# Patient Record
Sex: Male | Born: 1955 | Race: White | Hispanic: No | Marital: Married | Smoking: Never smoker
Health system: Southern US, Community
[De-identification: ages and names within clinical notes are randomized; demographics above are authoritative.]

## PROBLEM LIST (undated history)

## (undated) DIAGNOSIS — D649 Anemia, unspecified: Secondary | ICD-10-CM

## (undated) DIAGNOSIS — N189 Chronic kidney disease, unspecified: Secondary | ICD-10-CM

## (undated) DIAGNOSIS — I10 Essential (primary) hypertension: Secondary | ICD-10-CM

## (undated) DIAGNOSIS — E785 Hyperlipidemia, unspecified: Secondary | ICD-10-CM

## (undated) DIAGNOSIS — K219 Gastro-esophageal reflux disease without esophagitis: Secondary | ICD-10-CM

## (undated) HISTORY — DX: Anemia, unspecified: D64.9

## (undated) HISTORY — DX: Chronic kidney disease, unspecified: N18.9

## (undated) HISTORY — PX: OTHER SURGICAL HISTORY: SHX169

## (undated) HISTORY — DX: Gastro-esophageal reflux disease without esophagitis: K21.9

## (undated) HISTORY — DX: Hyperlipidemia, unspecified: E78.5

## (undated) HISTORY — DX: Essential (primary) hypertension: I10

---

## 2000-09-20 ENCOUNTER — Encounter: Admission: RE | Admit: 2000-09-20 | Discharge: 2000-09-20 | Payer: Self-pay | Admitting: Nephrology

## 2000-09-20 ENCOUNTER — Encounter: Payer: Self-pay | Admitting: Nephrology

## 2001-01-28 ENCOUNTER — Encounter: Admission: RE | Admit: 2001-01-28 | Discharge: 2001-01-28 | Payer: Self-pay | Admitting: Nephrology

## 2001-01-28 ENCOUNTER — Encounter: Payer: Self-pay | Admitting: Nephrology

## 2003-03-31 ENCOUNTER — Encounter: Admission: RE | Admit: 2003-03-31 | Discharge: 2003-03-31 | Payer: Self-pay | Admitting: Nephrology

## 2006-02-22 ENCOUNTER — Encounter: Admission: RE | Admit: 2006-02-22 | Discharge: 2006-02-22 | Payer: Self-pay | Admitting: Nephrology

## 2010-01-26 ENCOUNTER — Encounter
Admission: RE | Admit: 2010-01-26 | Discharge: 2010-01-26 | Payer: Self-pay | Source: Home / Self Care | Attending: Nephrology | Admitting: Nephrology

## 2011-04-11 ENCOUNTER — Encounter: Payer: Self-pay | Admitting: Internal Medicine

## 2011-04-18 ENCOUNTER — Encounter: Payer: Self-pay | Admitting: *Deleted

## 2011-04-24 ENCOUNTER — Encounter: Payer: Self-pay | Admitting: Internal Medicine

## 2011-04-24 ENCOUNTER — Telehealth: Payer: Self-pay | Admitting: Internal Medicine

## 2011-04-24 ENCOUNTER — Other Ambulatory Visit (INDEPENDENT_AMBULATORY_CARE_PROVIDER_SITE_OTHER): Payer: BC Managed Care – PPO

## 2011-04-24 ENCOUNTER — Ambulatory Visit (INDEPENDENT_AMBULATORY_CARE_PROVIDER_SITE_OTHER): Payer: BC Managed Care – PPO | Admitting: Internal Medicine

## 2011-04-24 VITALS — BP 132/80 | HR 96 | Ht 63.0 in | Wt 130.0 lb

## 2011-04-24 DIAGNOSIS — R112 Nausea with vomiting, unspecified: Secondary | ICD-10-CM

## 2011-04-24 DIAGNOSIS — K219 Gastro-esophageal reflux disease without esophagitis: Secondary | ICD-10-CM

## 2011-04-24 DIAGNOSIS — R634 Abnormal weight loss: Secondary | ICD-10-CM

## 2011-04-24 DIAGNOSIS — R197 Diarrhea, unspecified: Secondary | ICD-10-CM

## 2011-04-24 LAB — COMPREHENSIVE METABOLIC PANEL
AST: 28 U/L (ref 0–37)
Albumin: 4.5 g/dL (ref 3.5–5.2)
BUN: 20 mg/dL (ref 6–23)
CO2: 28 mEq/L (ref 19–32)
Calcium: 9.9 mg/dL (ref 8.4–10.5)
Chloride: 102 mEq/L (ref 96–112)
Creatinine, Ser: 2.3 mg/dL — ABNORMAL HIGH (ref 0.4–1.5)
GFR: 31.34 mL/min — ABNORMAL LOW (ref >60.00)
Glucose, Bld: 90 mg/dL (ref 70–99)
Potassium: 4.5 mEq/L (ref 3.5–5.1)

## 2011-04-24 LAB — CBC WITH DIFFERENTIAL/PLATELET
Basophils Absolute: 0 10*3/uL (ref 0.0–0.1)
Basophils Relative: 0.5 % (ref 0.0–3.0)
Eosinophils Absolute: 0.5 10*3/uL (ref 0.0–0.7)
Lymphocytes Relative: 19.9 % (ref 12.0–46.0)
MCHC: 32.8 g/dL (ref 30.0–36.0)
Monocytes Relative: 4.7 % (ref 3.0–12.0)
Neutrophils Relative %: 66.4 % (ref 43.0–77.0)
RBC: 4.93 Mil/uL (ref 4.22–5.81)

## 2011-04-24 LAB — LIPASE: Lipase: 40 U/L (ref 11.0–59.0)

## 2011-04-24 LAB — SEDIMENTATION RATE: Sed Rate: 11 mm/hr (ref 0–22)

## 2011-04-24 MED ORDER — ESOMEPRAZOLE MAGNESIUM 40 MG PO CPDR: 40.0000 mg | DELAYED_RELEASE_CAPSULE | Freq: Two times a day (BID) | ORAL | Status: DC

## 2011-04-24 NOTE — Telephone Encounter (Signed)
Hold Flagyl  For now.

## 2011-04-24 NOTE — Telephone Encounter (Signed)
Patient is taking Flagyl per Dr. Jimmy Footman. He wants to know if he should continue taking this. Please, advise.

## 2011-04-24 NOTE — Progress Notes (Signed)
Justin Owens 11-13-1955 MRN [id number]   History of Present Illness:  This is a 56 year old white male with progressive nausea and vomiting for the past year associated with diarrhea. He has lost about 10 pounds in 2 months. He apparently is also running a low-grade temperature. Vomiting of undigested food occurs within minutes or several hours after eating. He has taken omeprazole 20 mg a day without improvement. He was seen in 1992 by Dr.Pulliam and had some tests done for diarrhea including a flexible sigmoidoscopy at which time there was apparently a question of Crohn's disease. We unfortunately don't have those records. He has a diagnosis of IgA nephropathy, chronic renal insufficiency, anemia and high blood pressure. He denies dysphagia or odynophagia. He doesn't smoke and doesn't drink alcohol. His dentist has pointed out tooth enamel erosions which may be caused by acid reflux or vomiting. He, often vomits in the middle of the night. After evacuating the food, he usually feels better.     Past Medical History  Diagnosis Date  . GERD (gastroesophageal reflux disease)   . Hypertension   . CKD (chronic kidney disease)   . Hyperlipidemia   . Gout   . Hyperparathyroidism   . Anemia    No past surgical history on file.  reports that he has never smoked. He has never used smokeless tobacco. He reports that he does not drink alcohol or use illicit drugs. family history includes Diabetes in an unspecified family member and Heart disease in an unspecified family member.  There is no history of Colon cancer. No Known Allergies      Review of Systems:Small stature alert and oriented. Weight loss of 10 pounds. He denies rectal bleeding  The remainder of the 10 point ROS is negative except as outlined in H&P   Physical Exam: General appearance small stature, in no distress. Eyes- non icteric. HEENT nontraumatic, normocephalic. Mouth no lesions, tongue papillated, no cheilosis. Neck  supple without adenopathy, thyroid not enlarged, no carotid bruits, no JVD. Lungs Clear to auscultation bilaterally. Cor normal S1, normal S2, regular rhythm, no murmur,  quiet precordium. Abdomen: Soft nontender with normoactive bowel sounds. No distention. Liver edge at costal margin. No fullness.  Rectal:Soft Hemoccult negative stool.  Extremities no pedal edema. Skin no lesions. Neurological alert and oriented x 3. Psychological normal mood and affect.  Assessment and Plan:  Problem #1 Severe gastroesophageal reflux refractory to a low dose of PPI. The reflux has progressed. I  suspect that there may be  an anatomic obstruction, at the level of gastric outlet or in the small bowl.Severe gastroparesis may also result in increasing reflux. We need to rule out a terminal ileum obstruction by Crohn's disease. We have given him Nexium 40 mg twice a day until we can make a  diagnosis. He has Phenergan at home.  Problem #2 Chronic intermittent diarrhea and weight loss with a questionable history of Crohn's disease. We will proceed with a CT scan of the abdomen to assess his terminal ileum and will also obtain baseline chemistries including sedimentation rate and sprue profile. He will almost certainly need an upper endoscopy and colonoscopy but I feel that a CT scan of the abdomen should be done first to rule out a small bowel obstruction which could preclude him prepping for colonoscopy.   04/24/2011 Justin Owens

## 2011-04-24 NOTE — Patient Instructions (Addendum)
Your physician has requested that you go to the basement for the following lab work before leaving today: CMET, CBC, Amylase, Lipase, Sed Rate, Celiac Panel You have been scheduled for a CT scan of the abdomen and pelvis at [facility] ([address]---this is in the same building as [facility]).   You are scheduled on Thursday 04/26/11 at 10:00 am. You should arrive 15 minutes prior to your appointment time for registration. Please follow the written instructions below on the day of your exam:  WARNING: IF YOU ARE ALLERGIC TO IODINE/X-RAY DYE, PLEASE NOTIFY RADIOLOGY IMMEDIATELY AT [phone]! YOU WILL BE GIVEN A 13 HOUR PREMEDICATION PREP.  1) Do not eat or drink anything after 6:00 am (4 hours prior to your test) 2) You have been given 2 bottles of oral contrast to drink. The solution may taste better if refrigerated, but do NOT add ice or any other liquid to this solution. Shake well before drinking.    Drink 1 bottle of contrast @ 8:00 am (2 hours prior to your exam)  Drink 1 bottle of contrast @ 9:00 am (1 hour prior to your exam)  You may take any medications as prescribed with a small amount of water except for the following: Metformin, Glucophage, Glucovance, Avandamet, Riomet, Fortamet, Actoplus Met, Janumet, Glumetza or Metaglip. The above medications must be held the day of the exam AND 48 hours after the exam.  The purpose of you drinking the oral contrast is to aid in the visualization of your intestinal tract. The contrast solution may cause some diarrhea. Before your exam is started, you will be given a small amount of fluid to drink. Depending on your individual set of symptoms, you may also receive an intravenous injection of x-ray contrast/dye. Plan on being at [facility] for 30 minutes or long, depending on the type of exam you are having performed.  If you have any questions regarding your exam or if you need to reschedule, you may call the CT  department at [phone] between the hours of 8:00 am and 5:00 pm, Monday-Friday.  ________________________________________________________________________ We have sent the following medications to your pharmacy for you to pick up at your convenience: Nexium CC: Dr Jimmy Footman

## 2011-04-25 LAB — CELIAC PANEL 10
IgA: 301 mg/dL (ref 68–379)
Tissue Transglutaminase Ab, IgA: 4.6 U/mL (ref <20)

## 2011-04-25 NOTE — Telephone Encounter (Signed)
Spoke with patient and gave him Dr. Brodie's recommendation 

## 2011-04-26 ENCOUNTER — Ambulatory Visit (INDEPENDENT_AMBULATORY_CARE_PROVIDER_SITE_OTHER)
Admission: RE | Admit: 2011-04-26 | Discharge: 2011-04-26 | Disposition: A | Discharge status: Home / Self Care | Payer: BC Managed Care – PPO | Source: Ambulatory Visit | Attending: Internal Medicine | Admitting: Internal Medicine

## 2011-04-26 DIAGNOSIS — R112 Nausea with vomiting, unspecified: Secondary | ICD-10-CM

## 2011-04-30 ENCOUNTER — Encounter: Payer: Self-pay | Admitting: *Deleted

## 2011-07-03 ENCOUNTER — Other Ambulatory Visit: Payer: Self-pay | Admitting: Internal Medicine

## 2011-07-03 ENCOUNTER — Encounter: Payer: Self-pay | Admitting: Internal Medicine

## 2011-07-03 ENCOUNTER — Ambulatory Visit (AMBULATORY_SURGERY_CENTER): Payer: BC Managed Care – PPO | Admitting: *Deleted

## 2011-07-03 VITALS — Ht 63.0 in | Wt 138.8 lb

## 2011-07-03 DIAGNOSIS — K219 Gastro-esophageal reflux disease without esophagitis: Secondary | ICD-10-CM

## 2011-07-03 DIAGNOSIS — R112 Nausea with vomiting, unspecified: Secondary | ICD-10-CM

## 2011-07-03 DIAGNOSIS — R197 Diarrhea, unspecified: Secondary | ICD-10-CM

## 2011-07-03 MED ORDER — MOVIPREP 100 G PO SOLR: ORAL | Status: DC

## 2011-07-03 MED ORDER — ESOMEPRAZOLE MAGNESIUM 40 MG PO CPDR: 40.0000 mg | DELAYED_RELEASE_CAPSULE | Freq: Two times a day (BID) | ORAL | Status: DC

## 2011-07-03 NOTE — Progress Notes (Signed)
No allergies to eggs or soybeans 

## 2011-07-03 NOTE — Telephone Encounter (Signed)
RX sent

## 2011-07-04 ENCOUNTER — Telehealth: Payer: Self-pay | Admitting: Internal Medicine

## 2011-07-04 NOTE — Telephone Encounter (Signed)
I saw him 2 months ago, he was very sick. I cannot make any assessment at this time. He ought to follow with my recommendations. Nexiem 40 mg bid was given to tie him over  Till we can find out more about his symptoms.

## 2011-07-04 NOTE — Telephone Encounter (Signed)
Patient calling to see if he needs to have the ECOL that is scheduled since he is feeling better. He states the Nexium has helped his problem. He is taking it BID. He has gained 8 lbs pack. He is wondering if he can cancel the procedures. He is concerned about his insurance not paying the colonoscopy as screening since he came in with problems. Please, advise.

## 2011-07-05 NOTE — Telephone Encounter (Signed)
Patient notified of Dr. Brodie's recommendations. 

## 2011-07-05 NOTE — Telephone Encounter (Signed)
Left a message for patient to call me. 

## 2011-07-09 ENCOUNTER — Telehealth: Payer: Self-pay | Admitting: Internal Medicine

## 2011-07-09 NOTE — Telephone Encounter (Signed)
Spoke with patient, and he is not running a fever at this time.  He said that his head was stuffed up, and that he is blowing out yellow discharge.   I told him that this would not affect his procedures tomorrow, but if it lingered or went to his chest that he should see his PCP.   He agreed.  I also told him to call us if he starts running a fever.   He also agreed to that as well.  He said that he would take his medication for sinus and take his prep later.

## 2011-07-10 ENCOUNTER — Encounter: Payer: Self-pay | Admitting: Internal Medicine

## 2011-07-10 ENCOUNTER — Ambulatory Visit (AMBULATORY_SURGERY_CENTER): Payer: BC Managed Care – PPO | Admitting: Internal Medicine

## 2011-07-10 VITALS — BP 106/64 | HR 76 | Temp 96.9°F | Resp 19 | Ht 63.0 in | Wt 130.0 lb

## 2011-07-10 DIAGNOSIS — K219 Gastro-esophageal reflux disease without esophagitis: Secondary | ICD-10-CM

## 2011-07-10 DIAGNOSIS — D1330 Benign neoplasm of unspecified part of small intestine: Secondary | ICD-10-CM

## 2011-07-10 DIAGNOSIS — R197 Diarrhea, unspecified: Secondary | ICD-10-CM

## 2011-07-10 DIAGNOSIS — R112 Nausea with vomiting, unspecified: Secondary | ICD-10-CM

## 2011-07-10 DIAGNOSIS — D126 Benign neoplasm of colon, unspecified: Secondary | ICD-10-CM

## 2011-07-10 MED ORDER — SODIUM CHLORIDE 0.9 % IV SOLN: 500.0000 mL | INTRAVENOUS | Status: DC

## 2011-07-10 NOTE — Progress Notes (Signed)
10:36 Levin Erp, CRNA hung a 2nd bag of normal saline 0.9% 500 ml. Maw

## 2011-07-10 NOTE — Progress Notes (Signed)
Patient did not have preoperative order for IV antibiotic SSI prophylaxis. (G8918)  Patient did not experience any of the following events: a burn prior to discharge; a fall within the facility; wrong site/side/patient/procedure/implant event; or a hospital transfer or hospital admission upon discharge from the facility. (G8907)  

## 2011-07-10 NOTE — Patient Instructions (Addendum)
YOU HAD AN ENDOSCOPIC PROCEDURE TODAY AT THE [facility]: Refer to the procedure report that was given to you for any specific questions about what was found during the examination.  If the procedure report does not answer your questions, please call your gastroenterologist to clarify.  If you requested that your care partner not be given the details of your procedure findings, then the procedure report has been included in a sealed envelope for you to review at your convenience later.  YOU SHOULD EXPECT: Some feelings of bloating in the abdomen. Passage of more gas than usual.  Walking can help get rid of the air that was put into your GI tract during the procedure and reduce the bloating. If you had a lower endoscopy (such as a colonoscopy or flexible sigmoidoscopy) you may notice spotting of blood in your stool or on the toilet paper. If you underwent a bowel prep for your procedure, then you may not have a normal bowel movement for a few days.  DIET: Your first meal following the procedure should be a light meal and then it is ok to progress to your normal diet.  A half-sandwich or bowl of soup is an example of a good first meal.  Heavy or fried foods are harder to digest and may make you feel nauseous or bloated.  Likewise meals heavy in dairy and vegetables can cause extra gas to form and this can also increase the bloating.  Drink plenty of fluids but you should avoid alcoholic beverages for 24 hours.  ACTIVITY: Your care partner should take you home directly after the procedure.  You should plan to take it easy, moving slowly for the rest of the day.  You can resume normal activity the day after the procedure however you should NOT DRIVE or use heavy machinery for 24 hours (because of the sedation medicines used during the test).    SYMPTOMS TO REPORT IMMEDIATELY: A gastroenterologist can be reached at any hour.  During normal business hours, 8:30 AM to 5:00 PM Monday through Friday,  call [phone].  After hours and on weekends, please call the GI answering service at [phone] who will take a message and have the physician on call contact you.   Following lower endoscopy (colonoscopy or flexible sigmoidoscopy):  Excessive amounts of blood in the stool  Significant tenderness or worsening of abdominal pains  Swelling of the abdomen that is new, acute  Fever of 100F or higher  Following upper endoscopy (EGD)  Vomiting of blood or coffee ground material  New chest pain or pain under the shoulder blades  Painful or persistently difficult swallowing  New shortness of breath  Fever of 100F or higher  Black, tarry-looking stools  FOLLOW UP: If any biopsies were taken you will be contacted by phone or by letter within the next 1-3 weeks.  Call your gastroenterologist if you have not heard about the biopsies in 3 weeks.  Our staff will call the home number listed on your records the next business day following your procedure to check on you and address any questions or concerns that you may have at that time regarding the information given to you following your procedure. This is a courtesy call and so if there is no answer at the home number and we have not heard from you through the emergency physician on call, we will assume that you have returned to your regular daily activities without incident.  SIGNATURES/CONFIDENTIALITY: You and/or your care   partner have signed paperwork which will be entered into your electronic medical record.  These signatures attest to the fact that that the information above on your After Visit Summary has been reviewed and is understood.  Full responsibility of the confidentiality of this discharge information lies with you and/or your care-partner.  

## 2011-07-10 NOTE — Progress Notes (Signed)
10:23 Per Levin Erp, CRNA sinus rhythm then sinus pause.  Dr. Olevia Perches was notified by Freda Munro.  Levin Erp, CRNA administered robinal 0.2 mg iv.  Dr.  Olevia Perches asked if she should stop the colonoscopy.  Per Levin Erp, CRNA "I think this is vagal and it will resolve when you get to the cecum".  Dr. Olevia Perches proceed with the exam.  Maw

## 2011-07-10 NOTE — Op Note (Signed)
[facility] [address] [place], [place]  [id number]  ENDOSCOPY PROCEDURE REPORT  PATIENT:  Justin Owens, Justin Owens  MR#:  [id number] BIRTHDATE:  Feb 01, 1955, 55 yrs. old  GENDER:  male  ENDOSCOPIST:  Lowella Bandy. Olevia Perches, MD Referred by:  Melinda Crutch, M.D.  PROCEDURE DATE:  07/10/2011 PROCEDURE:  EGD with biopsy, [address] ASA CLASS:  Class II INDICATIONS:  nausea and vomiting, GERD, weight loss hx IgA nephropathy  MEDICATIONS:   MAC sedation, administered by CRNA, propofol (Diprivan) 200 mg TOPICAL ANESTHETIC:  none  DESCRIPTION OF PROCEDURE:   After the risks benefits and alternatives of the procedure were thoroughly explained, informed consent was obtained.  The LB GIF-H180 [id number] endoscope was introduced through the mouth and advanced to the second portion of the duodenum, without limitations.  The instrument was slowly withdrawn as the mucosa was fully examined. <<PROCEDUREIMAGES>>  irregular Z-line. With standard forceps, a biopsy was obtained and sent to pathology (see image1, image6, and image7). r/o Barrett's A hiatal hernia was found (see image8 and image5).  Otherwise the examination was normal. With standard forceps, a biopsy was obtained and sent to pathology (see image3, image2, and image4). small bowl biopsy to r/o villous atrophy    Retroflexed views revealed no abnormalities.    The scope was then withdrawn from the patient and the procedure completed.  COMPLICATIONS:  None  ENDOSCOPIC IMPRESSION: 1) Irregular Z-line 2) Hiatal hernia 3) Otherwise normal examination s/p small bowl bx and g-e junction bx RECOMMENDATIONS: 1) Await biopsy results 2) Anti-reflux regimen to be follow continue Nexiem 40 mg daily  REPEAT EXAM:  In 0 year(s) for.  ______________________________ Lowella Bandy. Olevia Perches, MD  CC:  n. eSIGNED:   Lowella Bandy. Takyla Kuchera at 07/10/2011 10:52 AM  [name], [id number]

## 2011-07-10 NOTE — Op Note (Signed)
[facility] [place], [place]  [id number]  COLONOSCOPY PROCEDURE REPORT  PATIENT:  Justin, Owens  MR#:  [id number] BIRTHDATE:  [birth year], 55 yrs. old  GENDER:  male ENDOSCOPIST:  Lowella Bandy. Olevia Perches, MD REF. BY:  Melinda Crutch, M.D. PROCEDURE DATE:  07/10/2011 PROCEDURE:  Colonoscopy with biopsy and snare polypectomy ASA CLASS:  Class II INDICATIONS:  unexplained diarrhea, weight loss MEDICATIONS:   MAC sedation, administered by CRNA, propofol (Diprivan) 150 mg  DESCRIPTION OF PROCEDURE:   After the risks and benefits and of the procedure were explained, informed consent was obtained. Digital rectal exam was performed and revealed no rectal masses. The LB CF-H180AL [id number] endoscope was introduced through the anus and advanced to the cecum, which was identified by both the appendix and ileocecal valve.  The quality of the prep was good, using MoviPrep.  The instrument was then slowly withdrawn as the colon was fully examined. <<PROCEDUREIMAGES>>  FINDINGS:  Two polyps were found. 3 mm sessile polyp at 80 cm, 20 mm multilobulated polyp at 15 cm The polyp was removed using cold biopsy forceps. Polyps were snared, then cauterized with monopolar cautery. Retrieval was successful (see image2, image5, image6, and image7). snare polyp  This was otherwise a normal examination of the colon. Random biopsies were obtained and sent to pathology (see image8, image4, and image3). r/o microscopic colitis Retroflexed views in the rectum revealed no abnormalities.    The scope was then withdrawn from the patient and the procedure completed.  COMPLICATIONS:  None ENDOSCOPIC IMPRESSION: 1) Two polyps 2) Otherwise normal examination s/p polypectomies RECOMMENDATIONS: 1) Await pathology results  REPEAT EXAM:  In 3 - 5 year(s) for.  ______________________________ Lowella Bandy. Olevia Perches, MD  CC:  n. eSIGNED:   Lowella Bandy. Arlicia Paquette at 07/10/2011 10:56 AM  [name],  [id number]

## 2011-07-10 NOTE — Progress Notes (Signed)
The pt tolerated the egd very well. Maw   

## 2011-07-10 NOTE — Progress Notes (Signed)
No sinus pause noted once the cecum was reached and the scope was being with drawn.  The pt remained at sinus rhythm. Maw

## 2011-07-11 ENCOUNTER — Telehealth: Payer: Self-pay

## 2011-07-11 NOTE — Telephone Encounter (Signed)
Left message

## 2011-07-16 ENCOUNTER — Encounter: Payer: Self-pay | Admitting: Internal Medicine

## 2011-08-07 ENCOUNTER — Other Ambulatory Visit: Payer: Self-pay | Admitting: Internal Medicine

## 2012-09-04 ENCOUNTER — Other Ambulatory Visit: Payer: Self-pay | Admitting: Internal Medicine

## 2013-01-01 ENCOUNTER — Other Ambulatory Visit: Payer: Self-pay | Admitting: Internal Medicine

## 2013-04-29 ENCOUNTER — Other Ambulatory Visit: Payer: Self-pay | Admitting: Internal Medicine

## 2013-05-06 ENCOUNTER — Other Ambulatory Visit: Payer: Self-pay | Admitting: Family Medicine

## 2013-05-06 DIAGNOSIS — S0990XA Unspecified injury of head, initial encounter: Secondary | ICD-10-CM

## 2013-05-07 ENCOUNTER — Other Ambulatory Visit: Payer: BC Managed Care – PPO

## 2013-08-21 IMAGING — CT CT ABD-PELV W/O CM
2 of 4 series · 17 of 46 positions shown, 19 images · non-contrast
Comparison: None

CLINICAL DATA: Nausea and vomiting.  Rule out bowel obstruction.

CT ABDOMEN AND PELVIS WITHOUT CONTRAST
TECHNIQUE: Multidetector CT imaging of the abdomen and pelvis was
performed following the standard protocol without intravenous
contrast.

[Series 2: ap without · axial · non-contrast · 0.80mm/px · z∈[-428,-58]mm · 14 of 82 slices shown, 16 images]
[im 4/82  soft-tissue]
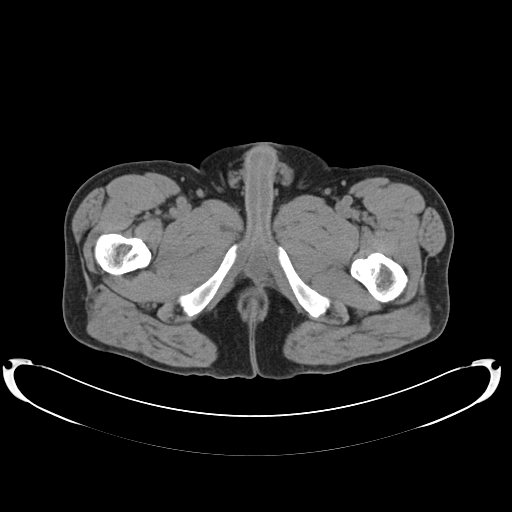
[im 4/82  bone]
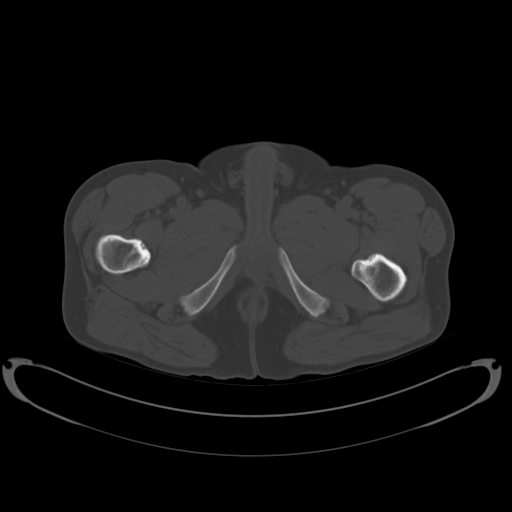
[im 11/82  soft-tissue]
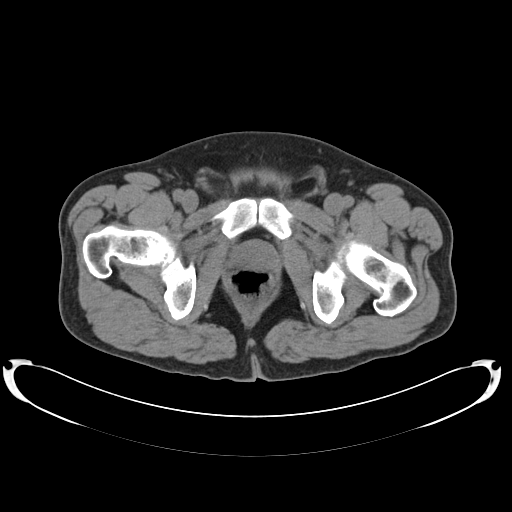
[im 17/82  soft-tissue]
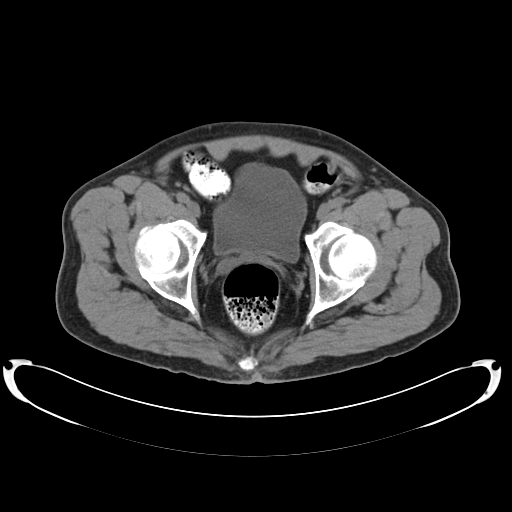
[im 21/82  soft-tissue]
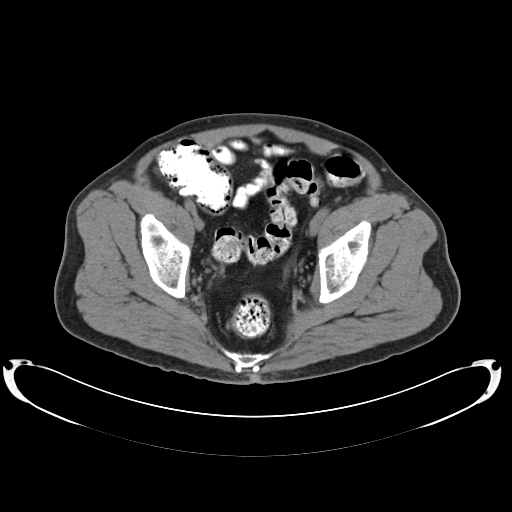
[im 28/82  soft-tissue]
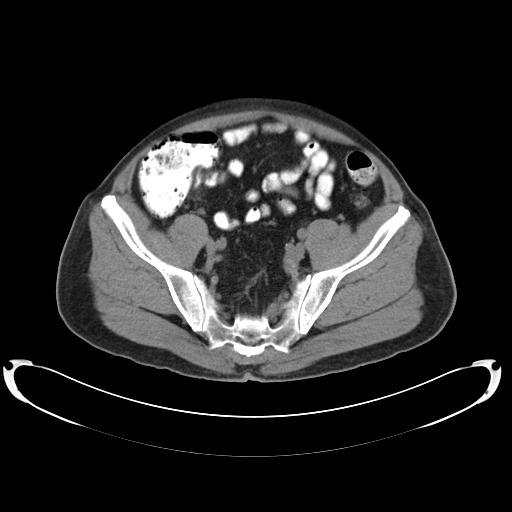
[im 34/82  soft-tissue]
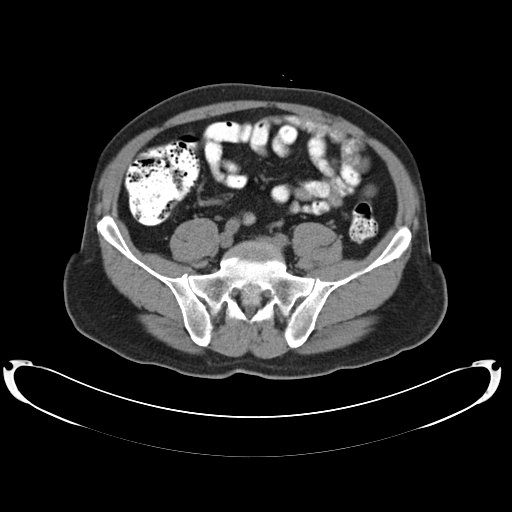
[im 38/82  soft-tissue]
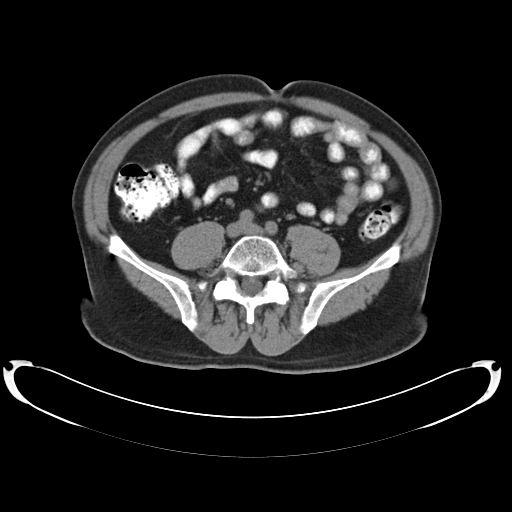
[im 44/82  soft-tissue]
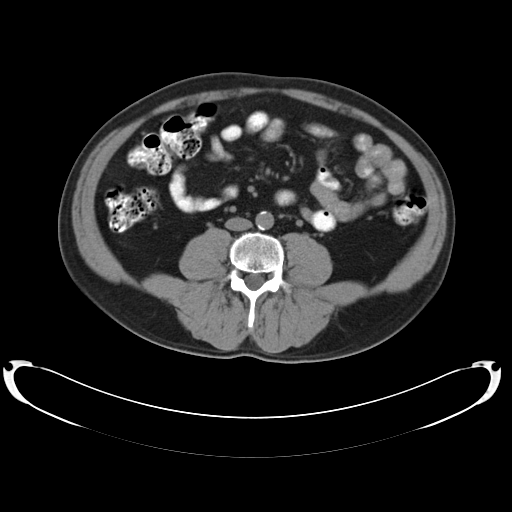
[im 48/82  soft-tissue]
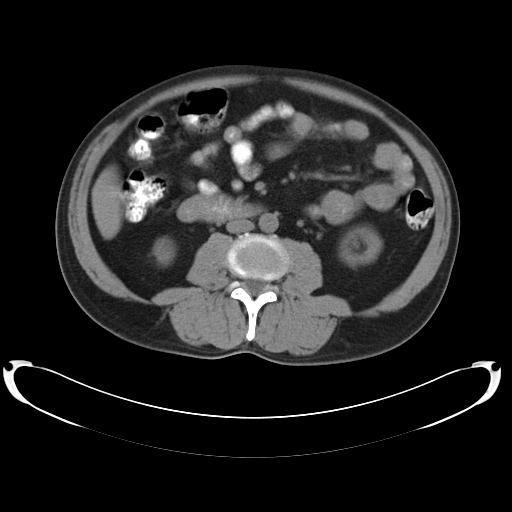
[im 48/82  bone]
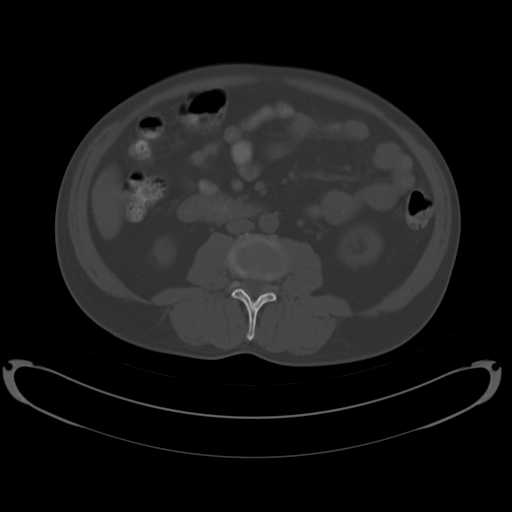
[im 55/82  soft-tissue]
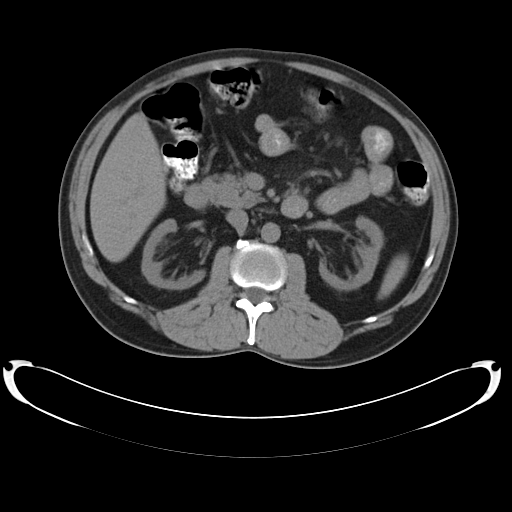
[im 61/82  soft-tissue]
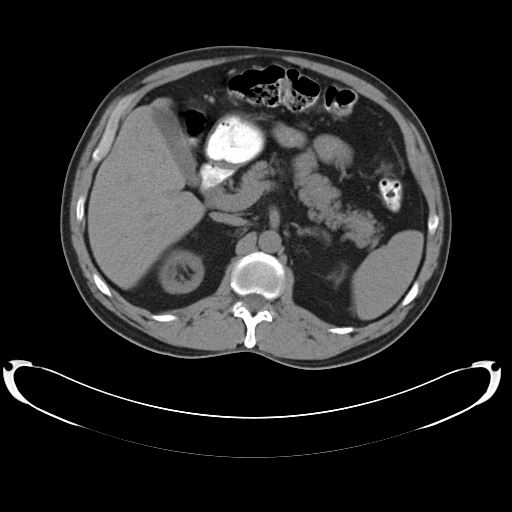
[im 65/82  soft-tissue]
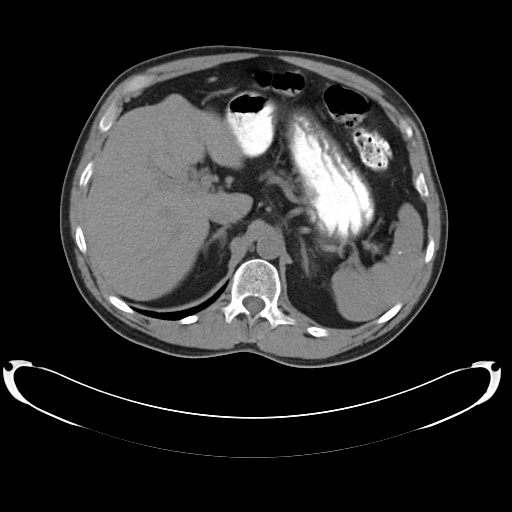
[im 71/82  soft-tissue]
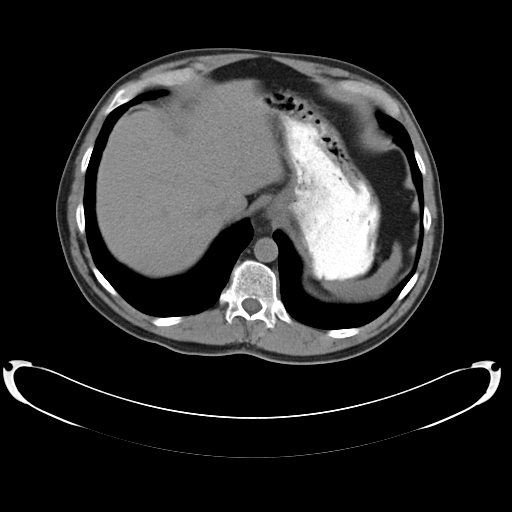
[im 78/82  soft-tissue]
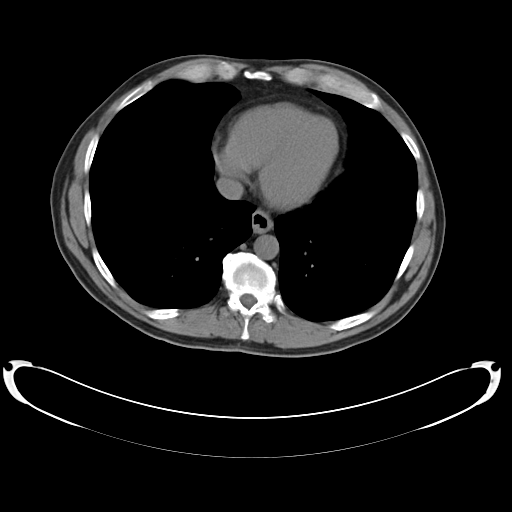

[Series 602: coronals · coronal · 0.83mm/px · 3 of 121 slices shown]
[im 41/121  soft-tissue]
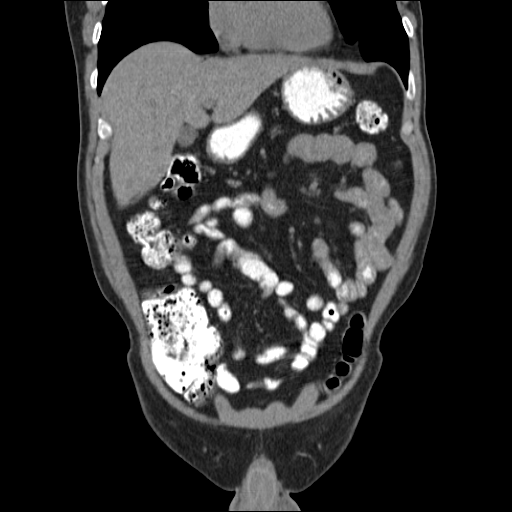
[im 54/121  soft-tissue]
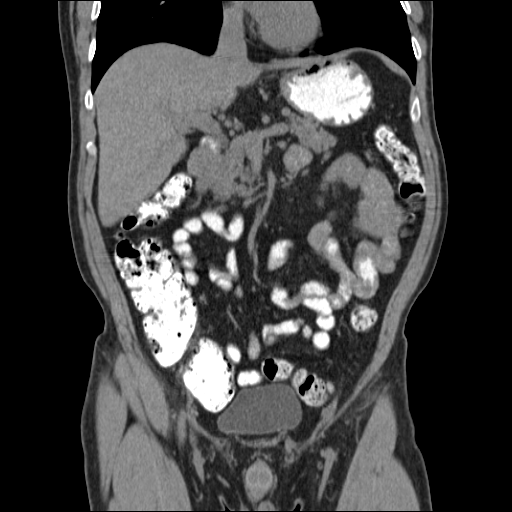
[im 67/121  soft-tissue]
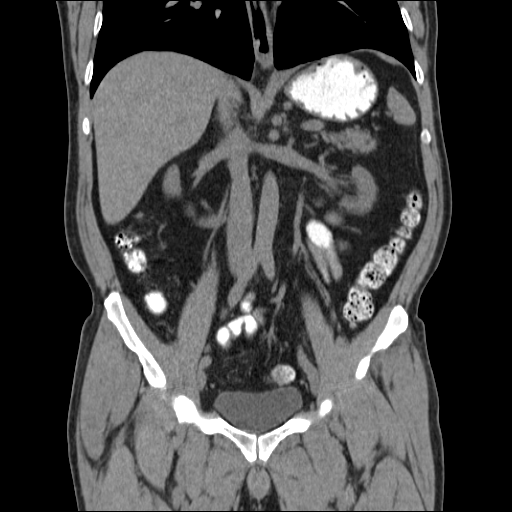

[17 of 46 positions shown; findings below may reference images not displayed]

FINDINGS: The lung bases appear clear.

No pericardial or pleural effusion identified.

No focal liver abnormalities.

The gallbladder appears normal.

There is no biliary dilatation.  Pancreas appears normal as well.

Both adrenal glands are normal.

Small calcified granuloma noted within the spleen.

There is a bilateral renal cortical thinning without evidence for
obstructive uropathy.

The no enlarged upper abdominal lymph nodes.

No pelvic or inguinal adenopathy.  The urinary bladder appears
normal.

No free fluid or fluid collections noted within the abdomen or
pelvis.

The stomach appears normal.

The small bowel loops are within normal limits.

Normal appearance of the colon.

Review of the visualized bony structures is significant for mild
degenerative disc disease.
IMPRESSION: 1.  No acute findings within the abdomen or pelvis.
2.  Bilateral renal cortical thinning.

## 2014-10-20 ENCOUNTER — Telehealth: Payer: Self-pay | Admitting: Internal Medicine

## 2014-10-20 NOTE — Telephone Encounter (Signed)
OK to refill for 90 days until his appointment with me but if you can please order a BMP and ask him to go to the lab to ensure normal renal function if he takes chronic nexium to ensure normal. Thanks

## 2014-10-20 NOTE — Telephone Encounter (Signed)
Patient haven't been seen since 04/2011 had procedure 06/2011. Requesting 90 day refill on esomeprazole. Has an appt with you 12/13/2014. Please advise.

## 2014-10-21 NOTE — Telephone Encounter (Signed)
Left a message on patient's answering machine for him to return my call.

## 2014-10-25 NOTE — Telephone Encounter (Signed)
Left a message on home answering machine for patient to return my call.

## 2014-10-28 NOTE — Telephone Encounter (Signed)
Left a message on patient's voice mail to return my call. 

## 2014-10-29 NOTE — Telephone Encounter (Signed)
Never received a return call form the patient, encounter closed.

## 2014-11-01 ENCOUNTER — Telehealth: Payer: Self-pay | Admitting: Gastroenterology

## 2014-11-01 MED ORDER — ESOMEPRAZOLE MAGNESIUM 40 MG PO CPDR: 40.0000 mg | DELAYED_RELEASE_CAPSULE | Freq: Two times a day (BID) | ORAL | Status: AC

## 2014-11-01 NOTE — Telephone Encounter (Signed)
Talked to the patient and let him know taht we would refill medication and to keep up coming appointment with Dr. Havery Moros. Patient informed per Dr. Havery Moros that he need to get BMP to check for kidney function, patient stated that he goes to Dr. Jimmy Footman at [facility] he gets his kidneys checked constantly and that we could get those labs form his office. Called [facility] and requested labs.

## 2014-11-02 ENCOUNTER — Telehealth: Payer: Self-pay | Admitting: Gastroenterology

## 2014-11-02 ENCOUNTER — Encounter: Payer: Self-pay | Admitting: Gastroenterology

## 2014-11-02 NOTE — Patient Instructions (Signed)
error 

## 2014-11-02 NOTE — Telephone Encounter (Signed)
Results given. Patient with CKD followed by nephrology. Cr 2.38, with GFR around 30. They are aware he is on PPI. He will continue this regimen if okay with nephrology

## 2014-11-02 NOTE — Telephone Encounter (Signed)
Talked to the patient about coming in to get BMP drawn to check kidney function since on Nexium. Patient stated that he is seen by [facility], Dr. Jimmy Footman and gets labs drawn constantly. Nexium refilled and Labs received and given to Dr. Havery Moros to review.

## 2014-12-13 ENCOUNTER — Ambulatory Visit: Payer: Self-pay | Admitting: Gastroenterology

## 2015-03-01 ENCOUNTER — Ambulatory Visit: Payer: Self-pay | Admitting: Gastroenterology

## 2015-04-19 ENCOUNTER — Encounter: Payer: Self-pay | Admitting: Gastroenterology

## 2015-04-19 ENCOUNTER — Ambulatory Visit (INDEPENDENT_AMBULATORY_CARE_PROVIDER_SITE_OTHER): Payer: BLUE CROSS/BLUE SHIELD | Admitting: Gastroenterology

## 2015-04-19 VITALS — BP 118/60 | HR 68 | Ht 63.0 in | Wt 134.0 lb

## 2015-04-19 DIAGNOSIS — K219 Gastro-esophageal reflux disease without esophagitis: Secondary | ICD-10-CM | POA: Insufficient documentation

## 2015-04-19 DIAGNOSIS — K635 Polyp of colon: Secondary | ICD-10-CM

## 2015-04-19 DIAGNOSIS — N079 Hereditary nephropathy, not elsewhere classified with unspecified morphologic lesions: Secondary | ICD-10-CM | POA: Diagnosis not present

## 2015-04-19 DIAGNOSIS — D126 Benign neoplasm of colon, unspecified: Secondary | ICD-10-CM

## 2015-04-19 DIAGNOSIS — N028 Recurrent and persistent hematuria with other morphologic changes: Secondary | ICD-10-CM | POA: Insufficient documentation

## 2015-04-19 MED ORDER — RANITIDINE HCL 150 MG PO TABS: 150.0000 mg | ORAL_TABLET | Freq: Two times a day (BID) | ORAL | Status: DC

## 2015-04-19 NOTE — Progress Notes (Signed)
HPI :  60 y/o male former patient of Dr. Olevia Perches, here for a follow up visit. He has not been seen since 2013. He has a history of CKD and GERD.  Patient followed for GERD, ongoing for years. He has had prior dental erosions from reflux when he first was diagnosed, in 2011. In 2013 he states he symptoms got worse.  He had been on nexium in 2013, started at 40mg  BID, and then when it helped, he tapered by to 40mg  daily. His typical reflux symptoms were regurgitation and vomiting.  He reports he thinks nexium helped considerably and took his symptoms mostly away at the time. He typically did not have any breakthrough that caused him problems when he took nexium.   More recently he is concerned about his nexium use and what has been in the news about side effects.   He has IgA nephropathy and is followed by nephrology who has been following his renal function closely.  He thinks over the past few years has slowly deteriorated over the past 2-3 years, GFR now in the 79s. He is not sure if this is due to his underlying disease progression or related to his nexium.  He reports since seeing things on the news about it he stopped the nexium for the past 60 days, and is using "apple cider vinegar". He denies any nausea or vomiting since stopping the nexium. He reports after he eats anything, particularly spicey foods, he has burning pyrosis in his chest. He can taste regurgitation of food. The vinegar is not helping too much. No dysphagia or odynophagia.  He reports most days he will have pyrosis that will bother him. He is wondering what he can safely take long term for his reflux. He had an EGD in 2013 which showed no evidence of Barrett's.  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 He denies any blood in the stools at present time or other bowel symptoms. No weight loss. Otherwise feeling well.   Prior workup: EGD 6/13 - irregular z-line, hiatal hernia, otherwise normal, path shows no  BE Colonoscopy 6/13 - 2cm tubulovilllous adenoma, otherwise other small adenoma, no evidence of microscopic colitis    Past Medical History  Diagnosis Date  . GERD (gastroesophageal reflux disease)   . Hypertension   . CKD (chronic kidney disease)   . Hyperlipidemia   . Gout   . Hyperparathyroidism   . Anemia      Past Surgical History  Procedure Laterality Date  . No prior surgery     Family History  Problem Relation Age of Onset  . Colon cancer Neg Hx   . Stomach cancer Neg Hx   . Diabetes    . Heart disease     Social History  Substance Use Topics  . Smoking status: Never Smoker   . Smokeless tobacco: Never Used  . Alcohol Use: No   Current Outpatient Prescriptions  Medication Sig Dispense Refill  . allopurinol (ZYLOPRIM) 300 MG tablet Take 300 mg by mouth daily.    . Chlorpheniramine Maleate (EQ CHLORTABS PO) Take by mouth as directed.    . enalapril (VASOTEC) 10 MG tablet Take 10 mg by mouth daily.    Marland Kitchen esomeprazole (NEXIUM) 40 MG capsule Take 1 capsule (40 mg total) by mouth 2 (two) times daily. 120 capsule 0  . loratadine (CLARITIN) 10 MG tablet Take 10 mg by mouth daily.    . Melatonin 3 MG TABS Take 3 mg by mouth  at bedtime as needed.    . simvastatin (ZOCOR) 20 MG tablet Take 20 mg by mouth every evening.    . ranitidine (ZANTAC) 150 MG tablet Take 1 tablet (150 mg total) by mouth 2 (two) times daily. 120 tablet 3   No current facility-administered medications for this visit.    Allergies  Allergen Reactions  . Iohexol     IV CONTRAST! Due to kidney disease (Per Dr Deterding)     Review of Systems: All systems reviewed and negative except where noted in HPI.    No results found.  Labs from 02/01/15 - Cr 2.61, BUN 37, GFR 26, normal LFTS CBC - Hgb 15.0, WBC 8.9, , platelets 252  Physical Exam: BP 118/60 mmHg  Pulse 68  Ht 5\' 3"  (1.6 m)  Wt 134 lb (60.782 kg)  BMI 23.74 kg/m2 Constitutional: Pleasant,well-developed, male in no acute  distress. HEENT: Normocephalic and atraumatic. Conjunctivae are normal. No scleral icterus. Neck supple.  Cardiovascular: Normal rate, regular rhythm.  Pulmonary/chest: Effort normal and breath sounds normal. No wheezing, rales or rhonchi. Abdominal: Soft, nondistended, nontender. Bowel sounds active throughout. There are no masses palpable. No hepatomegaly. Extremities: no edema Lymphadenopathy: No cervical adenopathy noted. Neurological: Alert and oriented to person place and time. Skin: Skin is warm and dry. No rashes noted. Psychiatric: Normal mood and affect. Behavior is normal.   ASSESSMENT AND PLAN: 60 y/o male with a history of CKD / IgA nephropathy with GFR in the 20s, with a history of symptomatic GERD. His GERD was previously very well controlled on nexium 40mg  daily however with worsening renal function he stopped it. Since that time he has had fairly frequent symptoms of pyrosis which bothers him. I discussed the risks of long term PPIs with him at length. The risks of long term PPIs with current data include increased risk for chronic kidney disease, increased risk of fracture, increased risk of C diff, increased risk of pneumonia (short term usage), potentially increased risk of B12 / calcium deficiency, and rare risk of hypomagnesemia. Recent studies have also shown an association with increased risk of dementia and cardiovascular outcomes including stroke. These studies have showed an association between PPIs and several of these outcomes but no evidence of causality. Given his interval worsening of renal function, I would recommend he avoid PPIs if possible in the management of his GERD. I discussed other options with him and recommend a trial of Zantac 150mg . He wishes to start this once daily, and can increase to BID as needed. If H2 blockers do not work to control his symptoms and his GERD is causing him to feel miserable, he could resume nexium but would recommend a low dose of  20mg , while understanding the risks of PPIs. He agreed and wishes to try the zantac, hopefully this can control his GERD, he will let me know if not. He will continue to follow up with his nephrologist for CKD.  Otherwise he had a 2cm tubulovillous adenoma removed in 2013 and is overdue for a surveillance colonoscopy. The indications, risks, and benefits of colonoscopy were explained to the patient in detail. Risks include but are not limited to bleeding, perforation, adverse reaction to medications, and cardiopulmonary compromise. Sequelae include but are not limited to the possibility of surgery, hospitalization, and mortality. The patient verbalized understanding and wished to proceed. All questions answered, referred to the scheduler and bowel prep ordered. Further recommendations pending results of the exam.   [place] Cellar, MD [facility] Pager  [phone]   

## 2015-04-19 NOTE — Patient Instructions (Signed)
It has been recommended to you by your physician that you have a(n) Screening Colonoscopy completed. Per your request, we did not schedule the procedure(s) today. Please contact our office at [phone] should you decide to have the procedure completed.  We will send in your prescription to your pharmacy

## 2016-02-21 ENCOUNTER — Other Ambulatory Visit: Payer: Self-pay | Admitting: Gastroenterology

## 2016-06-15 ENCOUNTER — Encounter: Payer: Self-pay | Admitting: Gastroenterology

## 2019-02-05 DIAGNOSIS — Z888 Allergy status to other drugs, medicaments and biological substances status: Secondary | ICD-10-CM | POA: Diagnosis not present

## 2019-02-05 DIAGNOSIS — Z882 Allergy status to sulfonamides status: Secondary | ICD-10-CM | POA: Diagnosis not present

## 2019-02-05 DIAGNOSIS — B259 Cytomegaloviral disease, unspecified: Secondary | ICD-10-CM | POA: Diagnosis not present

## 2019-02-05 DIAGNOSIS — Z79899 Other long term (current) drug therapy: Secondary | ICD-10-CM | POA: Diagnosis not present

## 2019-02-05 DIAGNOSIS — R748 Abnormal levels of other serum enzymes: Secondary | ICD-10-CM | POA: Diagnosis not present

## 2019-02-05 DIAGNOSIS — R5383 Other fatigue: Secondary | ICD-10-CM | POA: Diagnosis not present

## 2019-02-05 DIAGNOSIS — Z4822 Encounter for aftercare following kidney transplant: Secondary | ICD-10-CM | POA: Diagnosis not present

## 2019-02-05 DIAGNOSIS — J329 Chronic sinusitis, unspecified: Secondary | ICD-10-CM | POA: Diagnosis not present

## 2019-02-11 DIAGNOSIS — Z4822 Encounter for aftercare following kidney transplant: Secondary | ICD-10-CM | POA: Diagnosis not present

## 2019-02-18 DIAGNOSIS — Z4822 Encounter for aftercare following kidney transplant: Secondary | ICD-10-CM | POA: Diagnosis not present

## 2019-02-18 DIAGNOSIS — Z940 Kidney transplant status: Secondary | ICD-10-CM | POA: Diagnosis not present

## 2019-02-27 DIAGNOSIS — Z4822 Encounter for aftercare following kidney transplant: Secondary | ICD-10-CM | POA: Diagnosis not present

## 2019-03-05 DIAGNOSIS — D849 Immunodeficiency, unspecified: Secondary | ICD-10-CM | POA: Diagnosis not present

## 2019-03-05 DIAGNOSIS — Z5181 Encounter for therapeutic drug level monitoring: Secondary | ICD-10-CM | POA: Diagnosis not present

## 2019-03-05 DIAGNOSIS — B348 Other viral infections of unspecified site: Secondary | ICD-10-CM | POA: Diagnosis not present

## 2019-03-05 DIAGNOSIS — Z79899 Other long term (current) drug therapy: Secondary | ICD-10-CM | POA: Diagnosis not present

## 2019-03-05 DIAGNOSIS — Z940 Kidney transplant status: Secondary | ICD-10-CM | POA: Diagnosis not present

## 2019-03-05 DIAGNOSIS — B259 Cytomegaloviral disease, unspecified: Secondary | ICD-10-CM | POA: Diagnosis not present

## 2019-03-05 DIAGNOSIS — Z4822 Encounter for aftercare following kidney transplant: Secondary | ICD-10-CM | POA: Diagnosis not present

## 2019-03-18 DIAGNOSIS — Z4822 Encounter for aftercare following kidney transplant: Secondary | ICD-10-CM | POA: Diagnosis not present

## 2019-03-18 DIAGNOSIS — Z940 Kidney transplant status: Secondary | ICD-10-CM | POA: Diagnosis not present

## 2019-03-19 DIAGNOSIS — R0981 Nasal congestion: Secondary | ICD-10-CM | POA: Diagnosis not present

## 2019-03-19 DIAGNOSIS — Z79899 Other long term (current) drug therapy: Secondary | ICD-10-CM | POA: Diagnosis not present

## 2019-03-19 DIAGNOSIS — B370 Candidal stomatitis: Secondary | ICD-10-CM | POA: Diagnosis not present

## 2019-03-19 DIAGNOSIS — Z792 Long term (current) use of antibiotics: Secondary | ICD-10-CM | POA: Diagnosis not present

## 2019-03-19 DIAGNOSIS — Z4822 Encounter for aftercare following kidney transplant: Secondary | ICD-10-CM | POA: Diagnosis not present

## 2019-03-19 DIAGNOSIS — Z940 Kidney transplant status: Secondary | ICD-10-CM | POA: Diagnosis not present

## 2019-03-19 DIAGNOSIS — H9313 Tinnitus, bilateral: Secondary | ICD-10-CM | POA: Diagnosis not present

## 2019-03-19 DIAGNOSIS — B379 Candidiasis, unspecified: Secondary | ICD-10-CM | POA: Diagnosis not present

## 2019-03-19 DIAGNOSIS — D849 Immunodeficiency, unspecified: Secondary | ICD-10-CM | POA: Diagnosis not present

## 2019-03-19 DIAGNOSIS — Z7952 Long term (current) use of systemic steroids: Secondary | ICD-10-CM | POA: Diagnosis not present

## 2019-04-02 DIAGNOSIS — J302 Other seasonal allergic rhinitis: Secondary | ICD-10-CM | POA: Diagnosis not present

## 2019-04-02 DIAGNOSIS — E878 Other disorders of electrolyte and fluid balance, not elsewhere classified: Secondary | ICD-10-CM | POA: Diagnosis not present

## 2019-04-02 DIAGNOSIS — E785 Hyperlipidemia, unspecified: Secondary | ICD-10-CM | POA: Diagnosis not present

## 2019-04-02 DIAGNOSIS — B348 Other viral infections of unspecified site: Secondary | ICD-10-CM | POA: Diagnosis not present

## 2019-04-02 DIAGNOSIS — Z940 Kidney transplant status: Secondary | ICD-10-CM | POA: Diagnosis not present

## 2019-04-02 DIAGNOSIS — J309 Allergic rhinitis, unspecified: Secondary | ICD-10-CM | POA: Diagnosis not present

## 2019-04-02 DIAGNOSIS — Z79899 Other long term (current) drug therapy: Secondary | ICD-10-CM | POA: Diagnosis not present

## 2019-04-02 DIAGNOSIS — D649 Anemia, unspecified: Secondary | ICD-10-CM | POA: Diagnosis not present

## 2019-04-02 DIAGNOSIS — Z5181 Encounter for therapeutic drug level monitoring: Secondary | ICD-10-CM | POA: Diagnosis not present

## 2019-04-02 DIAGNOSIS — B349 Viral infection, unspecified: Secondary | ICD-10-CM | POA: Diagnosis not present

## 2019-04-02 DIAGNOSIS — K219 Gastro-esophageal reflux disease without esophagitis: Secondary | ICD-10-CM | POA: Diagnosis not present

## 2019-04-02 DIAGNOSIS — B259 Cytomegaloviral disease, unspecified: Secondary | ICD-10-CM | POA: Diagnosis not present

## 2019-04-02 DIAGNOSIS — M7989 Other specified soft tissue disorders: Secondary | ICD-10-CM | POA: Diagnosis not present

## 2019-04-02 DIAGNOSIS — Z4822 Encounter for aftercare following kidney transplant: Secondary | ICD-10-CM | POA: Diagnosis not present

## 2019-04-02 DIAGNOSIS — I10 Essential (primary) hypertension: Secondary | ICD-10-CM | POA: Diagnosis not present

## 2019-04-02 DIAGNOSIS — B370 Candidal stomatitis: Secondary | ICD-10-CM | POA: Diagnosis not present

## 2019-04-02 DIAGNOSIS — Z792 Long term (current) use of antibiotics: Secondary | ICD-10-CM | POA: Diagnosis not present

## 2019-04-02 DIAGNOSIS — Z7952 Long term (current) use of systemic steroids: Secondary | ICD-10-CM | POA: Diagnosis not present

## 2019-04-02 DIAGNOSIS — D849 Immunodeficiency, unspecified: Secondary | ICD-10-CM | POA: Diagnosis not present

## 2019-04-02 DIAGNOSIS — M109 Gout, unspecified: Secondary | ICD-10-CM | POA: Diagnosis not present

## 2019-04-14 DIAGNOSIS — Z4822 Encounter for aftercare following kidney transplant: Secondary | ICD-10-CM | POA: Diagnosis not present

## 2019-04-14 DIAGNOSIS — N189 Chronic kidney disease, unspecified: Secondary | ICD-10-CM | POA: Diagnosis not present

## 2019-04-30 DIAGNOSIS — M109 Gout, unspecified: Secondary | ICD-10-CM | POA: Diagnosis not present

## 2019-04-30 DIAGNOSIS — Z4822 Encounter for aftercare following kidney transplant: Secondary | ICD-10-CM | POA: Diagnosis not present

## 2019-04-30 DIAGNOSIS — Z940 Kidney transplant status: Secondary | ICD-10-CM | POA: Diagnosis not present

## 2019-04-30 DIAGNOSIS — D649 Anemia, unspecified: Secondary | ICD-10-CM | POA: Diagnosis not present

## 2019-04-30 DIAGNOSIS — Z79899 Other long term (current) drug therapy: Secondary | ICD-10-CM | POA: Diagnosis not present

## 2019-04-30 DIAGNOSIS — B370 Candidal stomatitis: Secondary | ICD-10-CM | POA: Diagnosis not present

## 2019-04-30 DIAGNOSIS — I10 Essential (primary) hypertension: Secondary | ICD-10-CM | POA: Diagnosis not present

## 2019-04-30 DIAGNOSIS — B258 Other cytomegaloviral diseases: Secondary | ICD-10-CM | POA: Diagnosis not present

## 2019-04-30 DIAGNOSIS — D849 Immunodeficiency, unspecified: Secondary | ICD-10-CM | POA: Diagnosis not present

## 2019-04-30 DIAGNOSIS — J302 Other seasonal allergic rhinitis: Secondary | ICD-10-CM | POA: Diagnosis not present

## 2019-04-30 DIAGNOSIS — Z792 Long term (current) use of antibiotics: Secondary | ICD-10-CM | POA: Diagnosis not present

## 2019-04-30 DIAGNOSIS — Z7951 Long term (current) use of inhaled steroids: Secondary | ICD-10-CM | POA: Diagnosis not present

## 2019-04-30 DIAGNOSIS — E785 Hyperlipidemia, unspecified: Secondary | ICD-10-CM | POA: Diagnosis not present

## 2019-04-30 DIAGNOSIS — K219 Gastro-esophageal reflux disease without esophagitis: Secondary | ICD-10-CM | POA: Diagnosis not present

## 2019-04-30 DIAGNOSIS — Z7952 Long term (current) use of systemic steroids: Secondary | ICD-10-CM | POA: Diagnosis not present

## 2019-05-14 DIAGNOSIS — M109 Gout, unspecified: Secondary | ICD-10-CM | POA: Diagnosis not present

## 2019-05-14 DIAGNOSIS — E785 Hyperlipidemia, unspecified: Secondary | ICD-10-CM | POA: Diagnosis not present

## 2019-05-14 DIAGNOSIS — R809 Proteinuria, unspecified: Secondary | ICD-10-CM | POA: Diagnosis not present

## 2019-05-14 DIAGNOSIS — Z7952 Long term (current) use of systemic steroids: Secondary | ICD-10-CM | POA: Diagnosis not present

## 2019-05-14 DIAGNOSIS — J302 Other seasonal allergic rhinitis: Secondary | ICD-10-CM | POA: Diagnosis not present

## 2019-05-14 DIAGNOSIS — Z79899 Other long term (current) drug therapy: Secondary | ICD-10-CM | POA: Diagnosis not present

## 2019-05-14 DIAGNOSIS — Z4822 Encounter for aftercare following kidney transplant: Secondary | ICD-10-CM | POA: Diagnosis not present

## 2019-05-14 DIAGNOSIS — D72819 Decreased white blood cell count, unspecified: Secondary | ICD-10-CM | POA: Diagnosis not present

## 2019-05-14 DIAGNOSIS — Z940 Kidney transplant status: Secondary | ICD-10-CM | POA: Diagnosis not present

## 2019-05-14 DIAGNOSIS — I10 Essential (primary) hypertension: Secondary | ICD-10-CM | POA: Diagnosis not present

## 2019-05-14 DIAGNOSIS — D849 Immunodeficiency, unspecified: Secondary | ICD-10-CM | POA: Diagnosis not present

## 2019-05-14 DIAGNOSIS — D649 Anemia, unspecified: Secondary | ICD-10-CM | POA: Diagnosis not present

## 2019-05-14 DIAGNOSIS — B370 Candidal stomatitis: Secondary | ICD-10-CM | POA: Diagnosis not present

## 2019-05-14 DIAGNOSIS — B349 Viral infection, unspecified: Secondary | ICD-10-CM | POA: Diagnosis not present

## 2019-05-14 DIAGNOSIS — B258 Other cytomegaloviral diseases: Secondary | ICD-10-CM | POA: Diagnosis not present

## 2019-05-14 DIAGNOSIS — Z792 Long term (current) use of antibiotics: Secondary | ICD-10-CM | POA: Diagnosis not present

## 2019-05-14 DIAGNOSIS — R600 Localized edema: Secondary | ICD-10-CM | POA: Diagnosis not present

## 2019-05-14 DIAGNOSIS — K219 Gastro-esophageal reflux disease without esophagitis: Secondary | ICD-10-CM | POA: Diagnosis not present

## 2019-06-02 DIAGNOSIS — Z79899 Other long term (current) drug therapy: Secondary | ICD-10-CM | POA: Diagnosis not present

## 2019-06-02 DIAGNOSIS — Z4822 Encounter for aftercare following kidney transplant: Secondary | ICD-10-CM | POA: Diagnosis not present

## 2019-06-02 DIAGNOSIS — I120 Hypertensive chronic kidney disease with stage 5 chronic kidney disease or end stage renal disease: Secondary | ICD-10-CM | POA: Diagnosis not present

## 2019-06-02 DIAGNOSIS — N186 End stage renal disease: Secondary | ICD-10-CM | POA: Diagnosis not present

## 2019-06-02 DIAGNOSIS — Z5181 Encounter for therapeutic drug level monitoring: Secondary | ICD-10-CM | POA: Diagnosis not present

## 2019-06-02 DIAGNOSIS — D631 Anemia in chronic kidney disease: Secondary | ICD-10-CM | POA: Diagnosis not present

## 2019-06-11 DIAGNOSIS — D485 Neoplasm of uncertain behavior of skin: Secondary | ICD-10-CM | POA: Diagnosis not present

## 2019-06-11 DIAGNOSIS — I10 Essential (primary) hypertension: Secondary | ICD-10-CM | POA: Diagnosis not present

## 2019-06-11 DIAGNOSIS — Z7952 Long term (current) use of systemic steroids: Secondary | ICD-10-CM | POA: Diagnosis not present

## 2019-06-11 DIAGNOSIS — N028 Recurrent and persistent hematuria with other morphologic changes: Secondary | ICD-10-CM | POA: Diagnosis not present

## 2019-06-11 DIAGNOSIS — I151 Hypertension secondary to other renal disorders: Secondary | ICD-10-CM | POA: Diagnosis not present

## 2019-06-11 DIAGNOSIS — L738 Other specified follicular disorders: Secondary | ICD-10-CM | POA: Diagnosis not present

## 2019-06-11 DIAGNOSIS — L570 Actinic keratosis: Secondary | ICD-10-CM | POA: Diagnosis not present

## 2019-06-11 DIAGNOSIS — J302 Other seasonal allergic rhinitis: Secondary | ICD-10-CM | POA: Diagnosis not present

## 2019-06-11 DIAGNOSIS — N2889 Other specified disorders of kidney and ureter: Secondary | ICD-10-CM | POA: Diagnosis not present

## 2019-06-11 DIAGNOSIS — Z8619 Personal history of other infectious and parasitic diseases: Secondary | ICD-10-CM | POA: Diagnosis not present

## 2019-06-11 DIAGNOSIS — L821 Other seborrheic keratosis: Secondary | ICD-10-CM | POA: Diagnosis not present

## 2019-06-11 DIAGNOSIS — Z79899 Other long term (current) drug therapy: Secondary | ICD-10-CM | POA: Diagnosis not present

## 2019-06-11 DIAGNOSIS — E785 Hyperlipidemia, unspecified: Secondary | ICD-10-CM | POA: Diagnosis not present

## 2019-06-11 DIAGNOSIS — B370 Candidal stomatitis: Secondary | ICD-10-CM | POA: Diagnosis not present

## 2019-06-11 DIAGNOSIS — D1801 Hemangioma of skin and subcutaneous tissue: Secondary | ICD-10-CM | POA: Diagnosis not present

## 2019-06-11 DIAGNOSIS — Z792 Long term (current) use of antibiotics: Secondary | ICD-10-CM | POA: Diagnosis not present

## 2019-06-11 DIAGNOSIS — M109 Gout, unspecified: Secondary | ICD-10-CM | POA: Diagnosis not present

## 2019-06-11 DIAGNOSIS — Z5181 Encounter for therapeutic drug level monitoring: Secondary | ICD-10-CM | POA: Diagnosis not present

## 2019-06-11 DIAGNOSIS — D2371 Other benign neoplasm of skin of right lower limb, including hip: Secondary | ICD-10-CM | POA: Diagnosis not present

## 2019-06-11 DIAGNOSIS — Z940 Kidney transplant status: Secondary | ICD-10-CM | POA: Diagnosis not present

## 2019-06-11 DIAGNOSIS — B349 Viral infection, unspecified: Secondary | ICD-10-CM | POA: Diagnosis not present

## 2019-06-11 DIAGNOSIS — K219 Gastro-esophageal reflux disease without esophagitis: Secondary | ICD-10-CM | POA: Diagnosis not present

## 2019-06-11 DIAGNOSIS — B259 Cytomegaloviral disease, unspecified: Secondary | ICD-10-CM | POA: Diagnosis not present

## 2019-06-11 DIAGNOSIS — Z4822 Encounter for aftercare following kidney transplant: Secondary | ICD-10-CM | POA: Diagnosis not present

## 2019-06-11 DIAGNOSIS — R809 Proteinuria, unspecified: Secondary | ICD-10-CM | POA: Diagnosis not present

## 2019-06-11 DIAGNOSIS — D649 Anemia, unspecified: Secondary | ICD-10-CM | POA: Diagnosis not present

## 2019-07-09 DIAGNOSIS — Z7952 Long term (current) use of systemic steroids: Secondary | ICD-10-CM | POA: Diagnosis not present

## 2019-07-09 DIAGNOSIS — Z8619 Personal history of other infectious and parasitic diseases: Secondary | ICD-10-CM | POA: Diagnosis not present

## 2019-07-09 DIAGNOSIS — D649 Anemia, unspecified: Secondary | ICD-10-CM | POA: Diagnosis not present

## 2019-07-09 DIAGNOSIS — R600 Localized edema: Secondary | ICD-10-CM | POA: Diagnosis not present

## 2019-07-09 DIAGNOSIS — Z940 Kidney transplant status: Secondary | ICD-10-CM | POA: Diagnosis not present

## 2019-07-09 DIAGNOSIS — N028 Recurrent and persistent hematuria with other morphologic changes: Secondary | ICD-10-CM | POA: Diagnosis not present

## 2019-07-09 DIAGNOSIS — E871 Hypo-osmolality and hyponatremia: Secondary | ICD-10-CM | POA: Diagnosis not present

## 2019-07-09 DIAGNOSIS — B349 Viral infection, unspecified: Secondary | ICD-10-CM | POA: Diagnosis not present

## 2019-07-09 DIAGNOSIS — I10 Essential (primary) hypertension: Secondary | ICD-10-CM | POA: Diagnosis not present

## 2019-07-09 DIAGNOSIS — D849 Immunodeficiency, unspecified: Secondary | ICD-10-CM | POA: Diagnosis not present

## 2019-07-09 DIAGNOSIS — Z4822 Encounter for aftercare following kidney transplant: Secondary | ICD-10-CM | POA: Diagnosis not present

## 2019-07-09 DIAGNOSIS — D72819 Decreased white blood cell count, unspecified: Secondary | ICD-10-CM | POA: Diagnosis not present

## 2019-07-09 DIAGNOSIS — Z792 Long term (current) use of antibiotics: Secondary | ICD-10-CM | POA: Diagnosis not present

## 2019-07-09 DIAGNOSIS — Z5181 Encounter for therapeutic drug level monitoring: Secondary | ICD-10-CM | POA: Diagnosis not present

## 2019-07-09 DIAGNOSIS — D2372 Other benign neoplasm of skin of left lower limb, including hip: Secondary | ICD-10-CM | POA: Diagnosis not present

## 2019-07-09 DIAGNOSIS — B370 Candidal stomatitis: Secondary | ICD-10-CM | POA: Diagnosis not present

## 2019-07-09 DIAGNOSIS — E785 Hyperlipidemia, unspecified: Secondary | ICD-10-CM | POA: Diagnosis not present

## 2019-07-09 DIAGNOSIS — B259 Cytomegaloviral disease, unspecified: Secondary | ICD-10-CM | POA: Diagnosis not present

## 2019-07-09 DIAGNOSIS — L989 Disorder of the skin and subcutaneous tissue, unspecified: Secondary | ICD-10-CM | POA: Diagnosis not present

## 2019-07-09 DIAGNOSIS — Z79899 Other long term (current) drug therapy: Secondary | ICD-10-CM | POA: Diagnosis not present

## 2019-07-09 DIAGNOSIS — R809 Proteinuria, unspecified: Secondary | ICD-10-CM | POA: Diagnosis not present

## 2019-07-09 DIAGNOSIS — J302 Other seasonal allergic rhinitis: Secondary | ICD-10-CM | POA: Diagnosis not present

## 2019-07-09 DIAGNOSIS — B348 Other viral infections of unspecified site: Secondary | ICD-10-CM | POA: Diagnosis not present

## 2019-07-09 DIAGNOSIS — K219 Gastro-esophageal reflux disease without esophagitis: Secondary | ICD-10-CM | POA: Diagnosis not present

## 2019-07-09 DIAGNOSIS — M109 Gout, unspecified: Secondary | ICD-10-CM | POA: Diagnosis not present

## 2019-07-16 DIAGNOSIS — D849 Immunodeficiency, unspecified: Secondary | ICD-10-CM | POA: Diagnosis not present

## 2019-07-16 DIAGNOSIS — D239 Other benign neoplasm of skin, unspecified: Secondary | ICD-10-CM | POA: Diagnosis not present

## 2019-07-16 DIAGNOSIS — L570 Actinic keratosis: Secondary | ICD-10-CM | POA: Diagnosis not present

## 2019-07-16 DIAGNOSIS — Z79899 Other long term (current) drug therapy: Secondary | ICD-10-CM | POA: Diagnosis not present

## 2019-08-06 DIAGNOSIS — Z4822 Encounter for aftercare following kidney transplant: Secondary | ICD-10-CM | POA: Diagnosis not present

## 2019-08-06 DIAGNOSIS — N2889 Other specified disorders of kidney and ureter: Secondary | ICD-10-CM | POA: Diagnosis not present

## 2019-08-06 DIAGNOSIS — K117 Disturbances of salivary secretion: Secondary | ICD-10-CM | POA: Diagnosis not present

## 2019-08-06 DIAGNOSIS — N028 Recurrent and persistent hematuria with other morphologic changes: Secondary | ICD-10-CM | POA: Diagnosis not present

## 2019-08-06 DIAGNOSIS — B259 Cytomegaloviral disease, unspecified: Secondary | ICD-10-CM | POA: Diagnosis not present

## 2019-08-06 DIAGNOSIS — Z5181 Encounter for therapeutic drug level monitoring: Secondary | ICD-10-CM | POA: Diagnosis not present

## 2019-08-06 DIAGNOSIS — M109 Gout, unspecified: Secondary | ICD-10-CM | POA: Diagnosis not present

## 2019-08-06 DIAGNOSIS — Z79899 Other long term (current) drug therapy: Secondary | ICD-10-CM | POA: Diagnosis not present

## 2019-08-06 DIAGNOSIS — Z888 Allergy status to other drugs, medicaments and biological substances status: Secondary | ICD-10-CM | POA: Diagnosis not present

## 2019-08-06 DIAGNOSIS — K219 Gastro-esophageal reflux disease without esophagitis: Secondary | ICD-10-CM | POA: Diagnosis not present

## 2019-08-06 DIAGNOSIS — Z940 Kidney transplant status: Secondary | ICD-10-CM | POA: Diagnosis not present

## 2019-08-06 DIAGNOSIS — I10 Essential (primary) hypertension: Secondary | ICD-10-CM | POA: Diagnosis not present

## 2019-08-06 DIAGNOSIS — E785 Hyperlipidemia, unspecified: Secondary | ICD-10-CM | POA: Diagnosis not present

## 2019-08-06 DIAGNOSIS — B370 Candidal stomatitis: Secondary | ICD-10-CM | POA: Diagnosis not present

## 2019-08-06 DIAGNOSIS — D849 Immunodeficiency, unspecified: Secondary | ICD-10-CM | POA: Diagnosis not present

## 2019-08-06 DIAGNOSIS — I151 Hypertension secondary to other renal disorders: Secondary | ICD-10-CM | POA: Diagnosis not present

## 2019-08-06 DIAGNOSIS — Z882 Allergy status to sulfonamides status: Secondary | ICD-10-CM | POA: Diagnosis not present

## 2019-08-06 DIAGNOSIS — G4700 Insomnia, unspecified: Secondary | ICD-10-CM | POA: Diagnosis not present

## 2019-09-03 DIAGNOSIS — B348 Other viral infections of unspecified site: Secondary | ICD-10-CM | POA: Diagnosis not present

## 2019-09-03 DIAGNOSIS — J302 Other seasonal allergic rhinitis: Secondary | ICD-10-CM | POA: Diagnosis not present

## 2019-09-03 DIAGNOSIS — B370 Candidal stomatitis: Secondary | ICD-10-CM | POA: Diagnosis not present

## 2019-09-03 DIAGNOSIS — Z79899 Other long term (current) drug therapy: Secondary | ICD-10-CM | POA: Diagnosis not present

## 2019-09-03 DIAGNOSIS — N028 Recurrent and persistent hematuria with other morphologic changes: Secondary | ICD-10-CM | POA: Diagnosis not present

## 2019-09-03 DIAGNOSIS — I10 Essential (primary) hypertension: Secondary | ICD-10-CM | POA: Diagnosis not present

## 2019-09-03 DIAGNOSIS — B259 Cytomegaloviral disease, unspecified: Secondary | ICD-10-CM | POA: Diagnosis not present

## 2019-09-03 DIAGNOSIS — D649 Anemia, unspecified: Secondary | ICD-10-CM | POA: Diagnosis not present

## 2019-09-03 DIAGNOSIS — K117 Disturbances of salivary secretion: Secondary | ICD-10-CM | POA: Diagnosis not present

## 2019-09-03 DIAGNOSIS — K219 Gastro-esophageal reflux disease without esophagitis: Secondary | ICD-10-CM | POA: Diagnosis not present

## 2019-09-03 DIAGNOSIS — Z5181 Encounter for therapeutic drug level monitoring: Secondary | ICD-10-CM | POA: Diagnosis not present

## 2019-09-03 DIAGNOSIS — R600 Localized edema: Secondary | ICD-10-CM | POA: Diagnosis not present

## 2019-09-03 DIAGNOSIS — L989 Disorder of the skin and subcutaneous tissue, unspecified: Secondary | ICD-10-CM | POA: Diagnosis not present

## 2019-09-03 DIAGNOSIS — Z940 Kidney transplant status: Secondary | ICD-10-CM | POA: Diagnosis not present

## 2019-09-03 DIAGNOSIS — Z792 Long term (current) use of antibiotics: Secondary | ICD-10-CM | POA: Diagnosis not present

## 2019-09-03 DIAGNOSIS — B338 Other specified viral diseases: Secondary | ICD-10-CM | POA: Diagnosis not present

## 2019-09-03 DIAGNOSIS — Z4822 Encounter for aftercare following kidney transplant: Secondary | ICD-10-CM | POA: Diagnosis not present

## 2019-09-03 DIAGNOSIS — D2372 Other benign neoplasm of skin of left lower limb, including hip: Secondary | ICD-10-CM | POA: Diagnosis not present

## 2019-09-03 DIAGNOSIS — Z7952 Long term (current) use of systemic steroids: Secondary | ICD-10-CM | POA: Diagnosis not present

## 2019-09-03 DIAGNOSIS — D72819 Decreased white blood cell count, unspecified: Secondary | ICD-10-CM | POA: Diagnosis not present

## 2019-09-03 DIAGNOSIS — D849 Immunodeficiency, unspecified: Secondary | ICD-10-CM | POA: Diagnosis not present

## 2019-09-03 DIAGNOSIS — M109 Gout, unspecified: Secondary | ICD-10-CM | POA: Diagnosis not present

## 2019-09-03 DIAGNOSIS — E785 Hyperlipidemia, unspecified: Secondary | ICD-10-CM | POA: Diagnosis not present

## 2019-09-03 DIAGNOSIS — R809 Proteinuria, unspecified: Secondary | ICD-10-CM | POA: Diagnosis not present

## 2019-09-22 DIAGNOSIS — U071 COVID-19: Secondary | ICD-10-CM | POA: Diagnosis not present

## 2019-09-22 DIAGNOSIS — Z940 Kidney transplant status: Secondary | ICD-10-CM | POA: Diagnosis not present

## 2019-09-22 DIAGNOSIS — R509 Fever, unspecified: Secondary | ICD-10-CM | POA: Diagnosis not present

## 2019-09-25 DIAGNOSIS — B370 Candidal stomatitis: Secondary | ICD-10-CM | POA: Diagnosis not present

## 2019-09-25 DIAGNOSIS — R509 Fever, unspecified: Secondary | ICD-10-CM | POA: Diagnosis not present

## 2019-09-25 DIAGNOSIS — Z940 Kidney transplant status: Secondary | ICD-10-CM | POA: Diagnosis not present

## 2019-09-25 DIAGNOSIS — U071 COVID-19: Secondary | ICD-10-CM | POA: Diagnosis not present

## 2019-09-30 DIAGNOSIS — N2889 Other specified disorders of kidney and ureter: Secondary | ICD-10-CM | POA: Diagnosis not present

## 2019-09-30 DIAGNOSIS — I120 Hypertensive chronic kidney disease with stage 5 chronic kidney disease or end stage renal disease: Secondary | ICD-10-CM | POA: Diagnosis not present

## 2019-09-30 DIAGNOSIS — Z79899 Other long term (current) drug therapy: Secondary | ICD-10-CM | POA: Diagnosis not present

## 2019-09-30 DIAGNOSIS — Z7952 Long term (current) use of systemic steroids: Secondary | ICD-10-CM | POA: Diagnosis not present

## 2019-09-30 DIAGNOSIS — N2581 Secondary hyperparathyroidism of renal origin: Secondary | ICD-10-CM | POA: Diagnosis not present

## 2019-09-30 DIAGNOSIS — D84821 Immunodeficiency due to drugs: Secondary | ICD-10-CM | POA: Diagnosis not present

## 2019-09-30 DIAGNOSIS — I511 Rupture of chordae tendineae, not elsewhere classified: Secondary | ICD-10-CM | POA: Diagnosis not present

## 2019-09-30 DIAGNOSIS — B259 Cytomegaloviral disease, unspecified: Secondary | ICD-10-CM | POA: Diagnosis not present

## 2019-09-30 DIAGNOSIS — Z882 Allergy status to sulfonamides status: Secondary | ICD-10-CM | POA: Diagnosis not present

## 2019-09-30 DIAGNOSIS — Z4822 Encounter for aftercare following kidney transplant: Secondary | ICD-10-CM | POA: Diagnosis not present

## 2019-09-30 DIAGNOSIS — K219 Gastro-esophageal reflux disease without esophagitis: Secondary | ICD-10-CM | POA: Diagnosis not present

## 2019-09-30 DIAGNOSIS — I10 Essential (primary) hypertension: Secondary | ICD-10-CM | POA: Diagnosis not present

## 2019-09-30 DIAGNOSIS — Z940 Kidney transplant status: Secondary | ICD-10-CM | POA: Diagnosis not present

## 2019-09-30 DIAGNOSIS — Z7982 Long term (current) use of aspirin: Secondary | ICD-10-CM | POA: Diagnosis not present

## 2019-09-30 DIAGNOSIS — U071 COVID-19: Secondary | ICD-10-CM | POA: Diagnosis not present

## 2019-09-30 DIAGNOSIS — D631 Anemia in chronic kidney disease: Secondary | ICD-10-CM | POA: Diagnosis not present

## 2019-09-30 DIAGNOSIS — B379 Candidiasis, unspecified: Secondary | ICD-10-CM | POA: Diagnosis not present

## 2019-09-30 DIAGNOSIS — Z888 Allergy status to other drugs, medicaments and biological substances status: Secondary | ICD-10-CM | POA: Diagnosis not present

## 2019-11-11 DIAGNOSIS — B259 Cytomegaloviral disease, unspecified: Secondary | ICD-10-CM | POA: Diagnosis not present

## 2019-11-11 DIAGNOSIS — N028 Recurrent and persistent hematuria with other morphologic changes: Secondary | ICD-10-CM | POA: Diagnosis not present

## 2019-11-11 DIAGNOSIS — Z5181 Encounter for therapeutic drug level monitoring: Secondary | ICD-10-CM | POA: Diagnosis not present

## 2019-11-11 DIAGNOSIS — Z882 Allergy status to sulfonamides status: Secondary | ICD-10-CM | POA: Diagnosis not present

## 2019-11-11 DIAGNOSIS — Z4822 Encounter for aftercare following kidney transplant: Secondary | ICD-10-CM | POA: Diagnosis not present

## 2019-11-11 DIAGNOSIS — Z888 Allergy status to other drugs, medicaments and biological substances status: Secondary | ICD-10-CM | POA: Diagnosis not present

## 2019-11-11 DIAGNOSIS — E785 Hyperlipidemia, unspecified: Secondary | ICD-10-CM | POA: Diagnosis not present

## 2019-11-11 DIAGNOSIS — K219 Gastro-esophageal reflux disease without esophagitis: Secondary | ICD-10-CM | POA: Diagnosis not present

## 2019-11-11 DIAGNOSIS — K117 Disturbances of salivary secretion: Secondary | ICD-10-CM | POA: Diagnosis not present

## 2019-11-11 DIAGNOSIS — I151 Hypertension secondary to other renal disorders: Secondary | ICD-10-CM | POA: Diagnosis not present

## 2019-11-11 DIAGNOSIS — Z79899 Other long term (current) drug therapy: Secondary | ICD-10-CM | POA: Diagnosis not present

## 2019-11-11 DIAGNOSIS — I10 Essential (primary) hypertension: Secondary | ICD-10-CM | POA: Diagnosis not present

## 2019-11-11 DIAGNOSIS — B348 Other viral infections of unspecified site: Secondary | ICD-10-CM | POA: Diagnosis not present

## 2019-11-11 DIAGNOSIS — R1030 Lower abdominal pain, unspecified: Secondary | ICD-10-CM | POA: Diagnosis not present

## 2019-11-11 DIAGNOSIS — D849 Immunodeficiency, unspecified: Secondary | ICD-10-CM | POA: Diagnosis not present

## 2019-11-11 DIAGNOSIS — N2889 Other specified disorders of kidney and ureter: Secondary | ICD-10-CM | POA: Diagnosis not present

## 2019-11-11 DIAGNOSIS — Z940 Kidney transplant status: Secondary | ICD-10-CM | POA: Diagnosis not present

## 2019-11-11 DIAGNOSIS — B370 Candidal stomatitis: Secondary | ICD-10-CM | POA: Diagnosis not present

## 2019-11-11 DIAGNOSIS — Z8616 Personal history of COVID-19: Secondary | ICD-10-CM | POA: Diagnosis not present

## 2019-11-11 DIAGNOSIS — D649 Anemia, unspecified: Secondary | ICD-10-CM | POA: Diagnosis not present

## 2019-11-11 DIAGNOSIS — M109 Gout, unspecified: Secondary | ICD-10-CM | POA: Diagnosis not present

## 2019-11-13 DIAGNOSIS — Z940 Kidney transplant status: Secondary | ICD-10-CM | POA: Diagnosis not present

## 2019-11-13 DIAGNOSIS — M3500 Sicca syndrome, unspecified: Secondary | ICD-10-CM | POA: Diagnosis not present

## 2019-11-13 DIAGNOSIS — Z79899 Other long term (current) drug therapy: Secondary | ICD-10-CM | POA: Diagnosis not present

## 2019-11-19 DIAGNOSIS — L570 Actinic keratosis: Secondary | ICD-10-CM | POA: Diagnosis not present

## 2019-11-19 DIAGNOSIS — D849 Immunodeficiency, unspecified: Secondary | ICD-10-CM | POA: Diagnosis not present

## 2019-11-19 DIAGNOSIS — L578 Other skin changes due to chronic exposure to nonionizing radiation: Secondary | ICD-10-CM | POA: Diagnosis not present

## 2019-12-09 DIAGNOSIS — K219 Gastro-esophageal reflux disease without esophagitis: Secondary | ICD-10-CM | POA: Diagnosis not present

## 2019-12-09 DIAGNOSIS — U071 COVID-19: Secondary | ICD-10-CM | POA: Diagnosis not present

## 2019-12-09 DIAGNOSIS — B370 Candidal stomatitis: Secondary | ICD-10-CM | POA: Diagnosis not present

## 2019-12-09 DIAGNOSIS — I10 Essential (primary) hypertension: Secondary | ICD-10-CM | POA: Diagnosis not present

## 2019-12-09 DIAGNOSIS — L989 Disorder of the skin and subcutaneous tissue, unspecified: Secondary | ICD-10-CM | POA: Diagnosis not present

## 2019-12-09 DIAGNOSIS — Z4822 Encounter for aftercare following kidney transplant: Secondary | ICD-10-CM | POA: Diagnosis not present

## 2019-12-09 DIAGNOSIS — Z7952 Long term (current) use of systemic steroids: Secondary | ICD-10-CM | POA: Diagnosis not present

## 2019-12-09 DIAGNOSIS — R682 Dry mouth, unspecified: Secondary | ICD-10-CM | POA: Diagnosis not present

## 2019-12-09 DIAGNOSIS — D849 Immunodeficiency, unspecified: Secondary | ICD-10-CM | POA: Diagnosis not present

## 2019-12-09 DIAGNOSIS — N185 Chronic kidney disease, stage 5: Secondary | ICD-10-CM | POA: Diagnosis not present

## 2019-12-09 DIAGNOSIS — E7849 Other hyperlipidemia: Secondary | ICD-10-CM | POA: Diagnosis not present

## 2019-12-09 DIAGNOSIS — Z940 Kidney transplant status: Secondary | ICD-10-CM | POA: Diagnosis not present

## 2019-12-09 DIAGNOSIS — I120 Hypertensive chronic kidney disease with stage 5 chronic kidney disease or end stage renal disease: Secondary | ICD-10-CM | POA: Diagnosis not present

## 2019-12-09 DIAGNOSIS — J302 Other seasonal allergic rhinitis: Secondary | ICD-10-CM | POA: Diagnosis not present

## 2019-12-09 DIAGNOSIS — Z79899 Other long term (current) drug therapy: Secondary | ICD-10-CM | POA: Diagnosis not present

## 2019-12-09 DIAGNOSIS — M109 Gout, unspecified: Secondary | ICD-10-CM | POA: Diagnosis not present

## 2020-01-07 DIAGNOSIS — B379 Candidiasis, unspecified: Secondary | ICD-10-CM | POA: Diagnosis not present

## 2020-01-07 DIAGNOSIS — Z940 Kidney transplant status: Secondary | ICD-10-CM | POA: Diagnosis not present

## 2020-01-07 DIAGNOSIS — M3500 Sicca syndrome, unspecified: Secondary | ICD-10-CM | POA: Diagnosis not present

## 2020-01-07 DIAGNOSIS — N028 Recurrent and persistent hematuria with other morphologic changes: Secondary | ICD-10-CM | POA: Diagnosis not present

## 2020-01-07 DIAGNOSIS — N186 End stage renal disease: Secondary | ICD-10-CM | POA: Diagnosis not present
